# Patient Record
Sex: Male | Born: 1975 | Hispanic: Yes | Marital: Single | Smoking: Current every day smoker
Health system: Southern US, Community
[De-identification: ages and names within clinical notes are randomized; demographics above are authoritative.]

---

## 2013-07-06 ENCOUNTER — Emergency Department (HOSPITAL_COMMUNITY): Payer: Self-pay

## 2013-07-06 ENCOUNTER — Emergency Department (HOSPITAL_COMMUNITY)
Admission: EM | Admit: 2013-07-06 | Discharge: 2013-07-06 | Disposition: A | Discharge status: Home / Self Care | Payer: Self-pay | Attending: Emergency Medicine | Admitting: Emergency Medicine

## 2013-07-06 ENCOUNTER — Encounter (HOSPITAL_COMMUNITY): Payer: Self-pay | Admitting: Emergency Medicine

## 2013-07-06 DIAGNOSIS — F10929 Alcohol use, unspecified with intoxication, unspecified: Secondary | ICD-10-CM

## 2013-07-06 DIAGNOSIS — S21209A Unspecified open wound of unspecified back wall of thorax without penetration into thoracic cavity, initial encounter: Secondary | ICD-10-CM | POA: Insufficient documentation

## 2013-07-06 DIAGNOSIS — S21219A Laceration without foreign body of unspecified back wall of thorax without penetration into thoracic cavity, initial encounter: Secondary | ICD-10-CM

## 2013-07-06 DIAGNOSIS — S51009A Unspecified open wound of unspecified elbow, initial encounter: Secondary | ICD-10-CM | POA: Insufficient documentation

## 2013-07-06 DIAGNOSIS — S0180XA Unspecified open wound of other part of head, initial encounter: Secondary | ICD-10-CM | POA: Insufficient documentation

## 2013-07-06 DIAGNOSIS — R6510 Systemic inflammatory response syndrome (SIRS) of non-infectious origin without acute organ dysfunction: Secondary | ICD-10-CM | POA: Insufficient documentation

## 2013-07-06 DIAGNOSIS — Z23 Encounter for immunization: Secondary | ICD-10-CM | POA: Insufficient documentation

## 2013-07-06 DIAGNOSIS — S01119A Laceration without foreign body of unspecified eyelid and periocular area, initial encounter: Secondary | ICD-10-CM

## 2013-07-06 DIAGNOSIS — F10229 Alcohol dependence with intoxication, unspecified: Secondary | ICD-10-CM | POA: Insufficient documentation

## 2013-07-06 LAB — I-STAT CHEM 8, ED
BUN: 5 mg/dL — ABNORMAL LOW (ref 6–23)
CALCIUM ION: 1.28 mmol/L — AB (ref 1.12–1.23)
CREATININE: 1.5 mg/dL — AB (ref 0.50–1.35)
Chloride: 108 mEq/L (ref 96–112)
GLUCOSE: 157 mg/dL — AB (ref 70–99)
HCT: 53 % — ABNORMAL HIGH (ref 39.0–52.0)
HEMOGLOBIN: 18 g/dL — AB (ref 13.0–17.0)
POTASSIUM: 4 meq/L (ref 3.7–5.3)
Sodium: 142 mEq/L (ref 137–147)
TCO2: 11 mmol/L (ref 0–100)

## 2013-07-06 MED ORDER — SODIUM CHLORIDE 0.9 % IV SOLN
Freq: Once | INTRAVENOUS | Status: AC
  Administered 2013-07-06: 20 mL/h via INTRAVENOUS

## 2013-07-06 MED ORDER — TRAMADOL HCL 50 MG PO TABS: 50.0000 mg | ORAL_TABLET | Freq: Four times a day (QID) | ORAL | Status: DC | PRN

## 2013-07-06 MED ORDER — TETANUS-DIPHTH-ACELL PERTUSSIS 5-2.5-18.5 LF-MCG/0.5 IM SUSP
0.5000 mL | Freq: Once | INTRAMUSCULAR | Status: AC
  Administered 2013-07-06: 0.5 mL via INTRAMUSCULAR
  Filled 2013-07-06: qty 0.5

## 2013-07-06 MED ORDER — BACITRACIN ZINC 500 UNIT/GM EX OINT: 1.0000 "application " | TOPICAL_OINTMENT | Freq: Two times a day (BID) | CUTANEOUS | Status: DC

## 2013-07-06 MED ORDER — SODIUM CHLORIDE 0.9 % IV BOLUS (SEPSIS)
1000.0000 mL | Freq: Once | INTRAVENOUS | Status: AC
  Administered 2013-07-06: 1000 mL via INTRAVENOUS
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
## 2013-07-06 NOTE — ED Provider Notes (Signed)
LACERATION REPAIR Performed by: Carolee RotaGEIPLE,Shironda Kain S Authorized by: Carolee RotaGEIPLE,Abbey Veith S Consent: Verbal consent obtained. Risks and benefits: risks, benefits and alternatives were discussed Consent given by: patient Patient identity confirmed: provided demographic data Prepped and Draped in normal sterile fashion Wound explored  Laceration Location: R middle back  Laceration Length: 12cm  No Foreign Bodies seen or palpated  Anesthesia: local infiltration  Local anesthetic: lidocaine 2% with epinephrine  Anesthetic total: 4 ml  Irrigation method: skin scrub with dermal cleanser Amount of cleaning: standard  Skin closure: 5-0 Ethilon  Number of sutures: 15  Technique: simple interrupted  Patient tolerance: Patient tolerated the procedure well with no immediate complications.    LACERATION REPAIR Performed by: Carolee RotaGEIPLE,Barrett Holthaus S Authorized by: Carolee RotaGEIPLE,Marquette Blodgett S Consent: Verbal consent obtained. Risks and benefits: risks, benefits and alternatives were discussed Consent given by: patient Patient identity confirmed: provided demographic data Prepped and Draped in normal sterile fashion Wound explored  Laceration Location: R eyebrow  Laceration Length: 2cm  No Foreign Bodies seen or palpated  Anesthesia: local infiltration  Local anesthetic: lidocaine 2% with epinephrine  Anesthetic total: 2 ml  Irrigation method: skin scrub with dermal cleanser Amount of cleaning: standard  Skin closure: 4-0 Prolene  Number of sutures: 3  Technique: simple interrupted  Patient tolerance: Patient tolerated the procedure well with no immediate complications.   Renne CriglerJoshua Safir Michalec, PA-C 07/06/13 1012

## 2013-07-06 NOTE — ED Notes (Addendum)
Alert, NAD, calm, interactive, no changes, 2nd liter NS hung/infusing, VSS, pt to xray, family at B[facility].

## 2013-07-06 NOTE — ED Notes (Signed)
Pt sleeping, arousable to voice, [facility] up to use urinal.

## 2013-07-06 NOTE — ED Notes (Signed)
[facility] alert, NAD, calm, interactive.
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
## 2013-07-12 NOTE — ED Provider Notes (Signed)
I directly supervised this procedure.   Brandt LoosenJulie Sharron Petruska, MD 07/12/13 [phone]

## 2013-08-08 ENCOUNTER — Emergency Department (HOSPITAL_COMMUNITY): Payer: Self-pay | Admitting: Anesthesiology

## 2013-08-08 ENCOUNTER — Emergency Department (HOSPITAL_COMMUNITY)
Admission: EM | Admit: 2013-08-08 | Discharge: 2013-08-08 | Disposition: A | Discharge status: Home / Self Care | Payer: Self-pay | Attending: Emergency Medicine | Admitting: Emergency Medicine

## 2013-08-08 ENCOUNTER — Emergency Department (HOSPITAL_COMMUNITY): Payer: Self-pay

## 2013-08-08 ENCOUNTER — Encounter (HOSPITAL_COMMUNITY): Payer: Self-pay | Admitting: Emergency Medicine

## 2013-08-08 ENCOUNTER — Encounter (HOSPITAL_COMMUNITY)
Admission: EM | Disposition: A | Discharge status: Home / Self Care | Payer: Self-pay | Source: Home / Self Care | Attending: Emergency Medicine

## 2013-08-08 ENCOUNTER — Encounter (HOSPITAL_COMMUNITY): Payer: Self-pay | Admitting: Anesthesiology

## 2013-08-08 DIAGNOSIS — IMO0002 Reserved for concepts with insufficient information to code with codable children: Secondary | ICD-10-CM | POA: Insufficient documentation

## 2013-08-08 DIAGNOSIS — F17200 Nicotine dependence, unspecified, uncomplicated: Secondary | ICD-10-CM | POA: Insufficient documentation

## 2013-08-08 DIAGNOSIS — K219 Gastro-esophageal reflux disease without esophagitis: Secondary | ICD-10-CM | POA: Insufficient documentation

## 2013-08-08 DIAGNOSIS — L02512 Cutaneous abscess of left hand: Secondary | ICD-10-CM

## 2013-08-08 HISTORY — PX: I & D EXTREMITY: SHX5045

## 2013-08-08 SURGERY — IRRIGATION AND DEBRIDEMENT EXTREMITY
Anesthesia: General | Laterality: Left

## 2013-08-08 MED ORDER — NEOSTIGMINE METHYLSULFATE 10 MG/10ML IV SOLN
INTRAVENOUS | Status: AC
  Filled 2013-08-08: qty 1

## 2013-08-08 MED ORDER — HYDROMORPHONE HCL PF 1 MG/ML IJ SOLN
0.2500 mg | INTRAMUSCULAR | Status: DC | PRN
  Administered 2013-08-08 (×2): 0.5 mg via INTRAVENOUS

## 2013-08-08 MED ORDER — ACETAMINOPHEN 160 MG/5ML PO SOLN: 325.0000 mg | ORAL | Status: DC | PRN

## 2013-08-08 MED ORDER — LACTATED RINGERS IV SOLN
INTRAVENOUS | Status: DC | PRN
  Administered 2013-08-08: 15:00:00 via INTRAVENOUS

## 2013-08-08 MED ORDER — LACTATED RINGERS IV SOLN: INTRAVENOUS | Status: DC

## 2013-08-08 MED ORDER — BUPIVACAINE HCL (PF) 0.25 % IJ SOLN
INTRAMUSCULAR | Status: DC | PRN
  Administered 2013-08-08: 10 mL

## 2013-08-08 MED ORDER — KETOROLAC TROMETHAMINE 30 MG/ML IJ SOLN
INTRAMUSCULAR | Status: AC
  Administered 2013-08-08: 30 mg via INTRAVENOUS
  Filled 2013-08-08: qty 1

## 2013-08-08 MED ORDER — FENTANYL CITRATE 0.05 MG/ML IJ SOLN
INTRAMUSCULAR | Status: AC
  Filled 2013-08-08: qty 5

## 2013-08-08 MED ORDER — SUCCINYLCHOLINE CHLORIDE 20 MG/ML IJ SOLN
INTRAMUSCULAR | Status: AC
  Filled 2013-08-08: qty 1

## 2013-08-08 MED ORDER — PHENYLEPHRINE 40 MCG/ML (10ML) SYRINGE FOR IV PUSH (FOR BLOOD PRESSURE SUPPORT)
PREFILLED_SYRINGE | INTRAVENOUS | Status: AC
  Filled 2013-08-08: qty 10

## 2013-08-08 MED ORDER — ROCURONIUM BROMIDE 50 MG/5ML IV SOLN
INTRAVENOUS | Status: AC
  Filled 2013-08-08: qty 1

## 2013-08-08 MED ORDER — ARTIFICIAL TEARS OP OINT
TOPICAL_OINTMENT | OPHTHALMIC | Status: AC
  Filled 2013-08-08: qty 3.5

## 2013-08-08 MED ORDER — GLYCOPYRROLATE 0.2 MG/ML IJ SOLN
INTRAMUSCULAR | Status: AC
  Filled 2013-08-08: qty 2

## 2013-08-08 MED ORDER — ONDANSETRON HCL 4 MG/2ML IJ SOLN: 4.0000 mg | Freq: Once | INTRAMUSCULAR | Status: DC | PRN

## 2013-08-08 MED ORDER — LIDOCAINE HCL (CARDIAC) 20 MG/ML IV SOLN
INTRAVENOUS | Status: DC | PRN
  Administered 2013-08-08: 80 mg via INTRAVENOUS

## 2013-08-08 MED ORDER — BUPIVACAINE HCL (PF) 0.25 % IJ SOLN
INTRAMUSCULAR | Status: AC
  Filled 2013-08-08: qty 30

## 2013-08-08 MED ORDER — OXYCODONE-ACETAMINOPHEN 5-325 MG PO TABS
2.0000 | ORAL_TABLET | Freq: Once | ORAL | Status: AC
  Administered 2013-08-08: 2 via ORAL
  Filled 2013-08-08: qty 2

## 2013-08-08 MED ORDER — ONDANSETRON HCL 4 MG/2ML IJ SOLN
INTRAMUSCULAR | Status: DC | PRN
  Administered 2013-08-08: 4 mg via INTRAVENOUS

## 2013-08-08 MED ORDER — CEFAZOLIN SODIUM-DEXTROSE 2-3 GM-% IV SOLR
INTRAVENOUS | Status: DC | PRN
  Administered 2013-08-08: 2 g via INTRAVENOUS

## 2013-08-08 MED ORDER — ONDANSETRON HCL 4 MG/2ML IJ SOLN
INTRAMUSCULAR | Status: AC
  Filled 2013-08-08: qty 2

## 2013-08-08 MED ORDER — FENTANYL CITRATE 0.05 MG/ML IJ SOLN
INTRAMUSCULAR | Status: DC | PRN
  Administered 2013-08-08: 25 ug via INTRAVENOUS
  Administered 2013-08-08: 50 ug via INTRAVENOUS
  Administered 2013-08-08: 75 ug via INTRAVENOUS
  Administered 2013-08-08: 50 ug via INTRAVENOUS
  Administered 2013-08-08 (×2): 25 ug via INTRAVENOUS

## 2013-08-08 MED ORDER — PROPOFOL 10 MG/ML IV BOLUS
INTRAVENOUS | Status: DC | PRN
  Administered 2013-08-08: 200 mg via INTRAVENOUS

## 2013-08-08 MED ORDER — SULFAMETHOXAZOLE-TRIMETHOPRIM 800-160 MG PO TABS: 1.0000 | ORAL_TABLET | Freq: Two times a day (BID) | ORAL | Status: AC

## 2013-08-08 MED ORDER — MIDAZOLAM HCL 5 MG/5ML IJ SOLN
INTRAMUSCULAR | Status: DC | PRN
  Administered 2013-08-08: 2 mg via INTRAVENOUS

## 2013-08-08 MED ORDER — 0.9 % SODIUM CHLORIDE (POUR BTL) OPTIME
TOPICAL | Status: DC | PRN
  Administered 2013-08-08: 1000 mL

## 2013-08-08 MED ORDER — MIDAZOLAM HCL 2 MG/2ML IJ SOLN
INTRAMUSCULAR | Status: AC
  Filled 2013-08-08: qty 2

## 2013-08-08 MED ORDER — OXYCODONE HCL 5 MG PO TABS
ORAL_TABLET | ORAL | Status: AC
  Administered 2013-08-08: 5 mg
  Filled 2013-08-08: qty 1

## 2013-08-08 MED ORDER — HYDROMORPHONE HCL PF 1 MG/ML IJ SOLN
INTRAMUSCULAR | Status: AC
  Administered 2013-08-08: 0.5 mg via INTRAVENOUS
  Filled 2013-08-08: qty 1

## 2013-08-08 MED ORDER — PROPOFOL 10 MG/ML IV BOLUS
INTRAVENOUS | Status: AC
  Filled 2013-08-08: qty 20

## 2013-08-08 MED ORDER — EPHEDRINE SULFATE 50 MG/ML IJ SOLN
INTRAMUSCULAR | Status: AC
  Filled 2013-08-08: qty 1

## 2013-08-08 MED ORDER — KETOROLAC TROMETHAMINE 30 MG/ML IJ SOLN
15.0000 mg | Freq: Once | INTRAMUSCULAR | Status: AC | PRN
  Administered 2013-08-08: 30 mg via INTRAVENOUS

## 2013-08-08 MED ORDER — HYDROCODONE-ACETAMINOPHEN 5-325 MG PO TABS: ORAL_TABLET | ORAL | Status: AC

## 2013-08-08 MED ORDER — ACETAMINOPHEN 325 MG PO TABS: 325.0000 mg | ORAL_TABLET | ORAL | Status: DC | PRN

## 2013-08-08 SURGICAL SUPPLY — 31 items
BNDG COHESIVE 1X5 TAN STRL LF (GAUZE/BANDAGES/DRESSINGS) ×3 IMPLANT
BNDG ESMARK 4X9 LF (GAUZE/BANDAGES/DRESSINGS) IMPLANT
CORDS BIPOLAR (ELECTRODE) ×3 IMPLANT
COVER SURGICAL LIGHT HANDLE (MISCELLANEOUS) ×3 IMPLANT
GAUZE XEROFORM 1X8 LF (GAUZE/BANDAGES/DRESSINGS) ×3 IMPLANT
GLOVE BIO SURGEON STRL SZ7.5 (GLOVE) ×3 IMPLANT
GLOVE BIOGEL PI IND STRL 8 (GLOVE) ×1 IMPLANT
GLOVE BIOGEL PI INDICATOR 8 (GLOVE) ×2
GOWN STRL REIN XL XLG (GOWN DISPOSABLE) ×3 IMPLANT
KIT BASIN OR (CUSTOM PROCEDURE TRAY) ×3 IMPLANT
KIT ROOM TURNOVER OR (KITS) ×3 IMPLANT
LOOP VESSEL MAXI BLUE (MISCELLANEOUS) IMPLANT
LOOP VESSEL MINI RED (MISCELLANEOUS) IMPLANT
MANIFOLD NEPTUNE II (INSTRUMENTS) ×3 IMPLANT
NEEDLE HYPO 25X1 1.5 SAFETY (NEEDLE) ×3 IMPLANT
NS IRRIG 1000ML POUR BTL (IV SOLUTION) ×3 IMPLANT
PACK ORTHO EXTREMITY (CUSTOM PROCEDURE TRAY) ×3 IMPLANT
PAD ARMBOARD 7.5X6 YLW CONV (MISCELLANEOUS) ×6 IMPLANT
SCRUB BETADINE 4OZ XXX (MISCELLANEOUS) ×3 IMPLANT
SOLUTION BETADINE 4OZ (MISCELLANEOUS) ×3 IMPLANT
SPONGE GAUZE 4X4 12PLY (GAUZE/BANDAGES/DRESSINGS) ×3 IMPLANT
SPONGE LAP 18X18 X RAY DECT (DISPOSABLE) ×3 IMPLANT
SUCTION FRAZIER TIP 10 FR DISP (SUCTIONS) ×3 IMPLANT
SUT MON AB 5-0 P3 18 (SUTURE) IMPLANT
SYR CONTROL 10ML LL (SYRINGE) ×3 IMPLANT
TOWEL OR 17X24 6PK STRL BLUE (TOWEL DISPOSABLE) ×3 IMPLANT
TOWEL OR 17X26 10 PK STRL BLUE (TOWEL DISPOSABLE) ×3 IMPLANT
TUBE ANAEROBIC SPECIMEN COL (MISCELLANEOUS) ×3 IMPLANT
TUBE CONNECTING 12'X1/4 (SUCTIONS) ×1
TUBE CONNECTING 12X1/4 (SUCTIONS) ×2 IMPLANT
UNDERPAD 30X30 INCONTINENT (UNDERPADS AND DIAPERS) ×3 IMPLANT

## 2013-08-08 NOTE — ED Provider Notes (Addendum)
Medical screening examination/treatment/procedure(s) were conducted as a shared visit with non-physician practitioner(s) and myself.  I personally evaluated the patient during the encounter.  L index finger felon with purulent drainage on palmar surface.  Flexion and extension of PIP, DIP, MCP intact.   EKG Interpretation None         Glynn OctaveStephen Birl Lobello, MD 08/08/13 1541  Glynn OctaveStephen Adia Crammer, MD 08/08/13 [phone]

## 2013-08-08 NOTE — Discharge Instructions (Signed)

## 2013-08-08 NOTE — ED Notes (Signed)
Pt transported to and from radiology on stretcher with tech, tolerated well.  

## 2013-08-08 NOTE — Discharge Planning (Signed)
[facility] Community Liaison  Spoke to patient regarding primary care resources and the [facility]. Orange card application and instructions on how to complete and obtain the orange card provided. Resource guide and my contact information also provided for any future questions or concerns. No other Community Liaison needs identified at this time.

## 2013-08-08 NOTE — ED Notes (Signed)
Dr Kuzma at bedside. 

## 2013-08-08 NOTE — Anesthesia Postprocedure Evaluation (Signed)
  Anesthesia Post-op Note  Patient: Adrian Houston  Procedure(s) Performed: Procedure(s): IRRIGATION AND DEBRIDEMENT EXTREMITY  LEFT INDEX FINGER (Left)  Patient Location: PACU  Anesthesia Type: General   Level of Consciousness: awake, alert  and oriented  Airway and Oxygen Therapy: Patient Spontanous Breathing  Post-op Pain: mild  Post-op Assessment: Post-op Vital signs reviewed  Post-op Vital Signs: Reviewed  Last Vitals:  Filed Vitals:   08/08/13 1800  BP:   Pulse: 66  Temp:   Resp: 15    Complications: No apparent anesthesia complications

## 2013-08-08 NOTE — Brief Op Note (Signed)
08/08/2013  4:18 PM  PATIENT:  Adrian Houston  38 y.o. male  PRE-OPERATIVE DIAGNOSIS:  infected left index finger  POST-OPERATIVE DIAGNOSIS:  infected left index finger  PROCEDURE:  Procedure(s): IRRIGATION AND DEBRIDEMENT EXTREMITY  LEFT INDEX FINGER (Left)  SURGEON:  Surgeon(s) and Role:    * Tami RibasKevin R Aubrionna Istre, MD - Primary  PHYSICIAN ASSISTANT:   ASSISTANTS: none   ANESTHESIA:   general  EBL:  Total I/O In: 600 [I.V.:600] Out: -   BLOOD ADMINISTERED:none  DRAINS: iodoform packing  LOCAL MEDICATIONS USED:  MARCAINE     SPECIMEN:  Source of Specimen:  left index finger  DISPOSITION OF SPECIMEN:  micro  COUNTS:  YES  TOURNIQUET:   Total Tourniquet Time Documented: Upper Arm (Left) - 16 minutes Total: Upper Arm (Left) - 16 minutes   DICTATION: .Other Dictation: Dictation Number [id number]  PLAN OF CARE: Discharge to home after PACU  PATIENT DISPOSITION:  PACU - hemodynamically stable.

## 2013-08-08 NOTE — H&P (Signed)
  Adrian Houston is an 38 y.o. male.   Chief Complaint: left index finger infection/foreign body HPI: 38 yo rhd male states his left index finger has been swelling and increasingly painful over past two to three days.  Thinks he got a piece of foreign material in finger.  States it feels as though there is something in the finger still.  No previous problems with left index finger and no other issue at this time.  History reviewed. No pertinent past medical history.  No past surgical history on file.  No family history on file. Social History:  reports that he has been smoking.  He does not have any smokeless tobacco history on file. He reports that he drinks alcohol. He reports that he does not use illicit drugs.  Allergies: No Known Allergies   (Not in a hospital admission)  No results found for this or any previous visit (from the past 48 hour(s)).  Dg Finger Index Left  08/08/2013   CLINICAL DATA:  Recent puncture wound now with pain and swelling for 2 days  EXAM: LEFT INDEX FINGER 2+V  COMPARISON:  None.  FINDINGS: There is soft tissue swelling over the distal aspect of the index finger. There is no soft tissue gas. The bony structures are unremarkable. No radiopaque foreign body is evident.  IMPRESSION: There is soft tissue swelling consistent with known inflammatory change. No acute bony abnormality is demonstrated.   Electronically Signed   By: David  [place]azil[place]   On: 08/08/2013 12:15     A comprehensive review of systems was negative.  Blood pressure 153/86, pulse 63, temperature 98 F (36.7 C), resp. rate 24, height 5\' 6"  (1.676 m), weight 108.863 kg (240 lb), SpO2 100.00%.  General appearance: alert, cooperative and appears stated age Head: Normocephalic, without obvious abnormality, atraumatic Neck: supple, symmetrical, trachea midline Resp: clear to auscultation bilaterally Cardio: regular rate and rhythm GI: non tender Extremities: intact sensation and capillary refill all  digits.  +epl/fpl/io.  left index with swelling over pad and active purulent drainage.  erythema dorsally at dorsal nail fold as well.  ttp distal phalanx.  no proximal streaking.  no tenderness/swelling/erythema over middle or proximal phalanges.  able to flex and extend finger without pain. Pulses: 2+ and symmetric Skin: Skin color, texture, turgor normal. No rashes or lesions Neurologic: Grossly normal Incision/Wound: As above  Assessment/Plan Left index finger felon/paronychia possible retained foreign body.  Plan OR for I&D and possible removal foreign body.  Risks, benefits, and alternatives of surgery were discussed and the patient agrees with the plan of care.   Adrian Houston R 08/08/2013, 2:42 PM

## 2013-08-08 NOTE — ED Provider Notes (Signed)
CSN: [id number]     Arrival date & time 08/08/13  1021 History  This chart was scribed for non-physician practitioner Junius FinnerErin O'Malley, working with Glynn OctaveStephen Rancour, MD by Carl Bestelina Holson, ED Scribe. This patient was seen in room OTFC/OTF and the patient's care was started at 11:07 AM.    Chief Complaint  Patient presents with  . Finger Injury   The history is provided by the patient. No language interpreter was used.   HPI Comments: Adrian Houston is a 38 y.o. male who presents to the Emergency Department complaining of constant left index finger pain with associated swelling that started two days ago after the patient noticed a splinter in the area.  The patient rates the pain at a 9/10 currently.  He denies numbness in his left index finger as an associated symptom.  The patient denies having a history of DM or other medical problems.  The patient's last TDAP was this month.  He denies having any allergies to medications.  The patient states that he ate and drank coffee this morning.   History reviewed. No pertinent past medical history. No past surgical history on file. No family history on file. History  Substance Use Topics  . Smoking status: Current Every Day Smoker  . Smokeless tobacco: Not on file  . Alcohol Use: Yes    Review of Systems  Constitutional: Negative for fever.  Musculoskeletal: Positive for arthralgias and joint swelling.  Skin: Positive for wound.  Neurological: Negative for numbness.  All other systems reviewed and are negative.     Allergies  Review of patient's allergies indicates no known allergies.  Home Medications   Prior to Admission medications   Medication Sig Start Date End Date Taking? Authorizing Provider  acetaminophen (TYLENOL) 325 MG tablet Take 650 mg by mouth every 6 (six) hours as needed.   Yes Historical Provider, MD  ibuprofen (ADVIL,MOTRIN) 200 MG tablet Take 400 mg by mouth every 6 (six) hours as needed for moderate pain.     Historical Provider, MD   Triage Vitals: BP 156/98  Pulse 90  Temp(Src) 98 F (36.7 C)  Resp 18  Ht 5\' 6"  (1.676 m)  Wt 240 lb (108.863 kg)  BMI 38.76 kg/m2  SpO2 97%  Physical Exam  Nursing note and vitals reviewed. Constitutional: He is oriented to person, place, and time. He appears well-developed and well-nourished.  HENT:  Head: Normocephalic and atraumatic.  Eyes: EOM are normal.  Neck: Normal range of motion.  Cardiovascular: Normal rate.   Pulmonary/Chest: Effort normal.  Musculoskeletal: Normal range of motion.  Full range of motion of the left index finger.    Neurological: He is alert and oriented to person, place, and time.  Skin: Skin is warm and dry.  Left index finger distal phalanx moderate edema.  Tenderness on volar aspect with centralized area of dried blood and scant red oozing blood.  Capillary refill is less than 3 seconds.  No nailbed involvement.  Psychiatric: He has a normal mood and affect. His behavior is normal.    ED Course  Procedures (including critical care time)  DIAGNOSTIC STUDIES: Oxygen Saturation is 97% on room air, adequate by my interpretation.    COORDINATION OF CARE: 11:08 AM- Discussed obtaining an x-ray of the patient's left hand and consulting with a hand specialist to verify a clinical suspicion of infection in the patient's left index finger.  Will administer pain medication in the ED.  The patient agreed to the treatment plan.  Labs Review Labs Reviewed - No data to display  Imaging Review Dg Finger Index Left  08/08/2013   CLINICAL DATA:  Recent puncture wound now with pain and swelling for 2 days  EXAM: LEFT INDEX FINGER 2+V  COMPARISON:  None.  FINDINGS: There is soft tissue swelling over the distal aspect of the index finger. There is no soft tissue gas. The bony structures are unremarkable. No radiopaque foreign body is evident.  IMPRESSION: There is soft tissue swelling consistent with known inflammatory change. No  acute bony abnormality is demonstrated.   Electronically Signed   By: David  [place]   On: 08/08/2013 12:15     EKG Interpretation None      MDM   Final diagnoses:  Felon, left    Pt is a 38yo male presenting to ED c/o left index finger pain x2 days. Pt states he feels like he has a splinter or piece of metal inside. No known injury. On exam, left distal index finger characteristic of a felon.  Plain films: soft tissue swelling, no acute bony abnormality or foreign body.  Consulted with Dr. Merlyn Lot who also examined pt. Pt will be taken to the OR for I&D as pt believes something is still in his finger.    I personally performed the services described in this documentation, which was scribed in my presence. The recorded information has been reviewed and is accurate.    Junius Finner, PA-C 08/08/13 1452

## 2013-08-08 NOTE — Op Note (Signed)
[id number] 

## 2013-08-08 NOTE — Transfer of Care (Signed)
Immediate Anesthesia Transfer of Care Note  Patient: Adrian Houston  Procedure(s) Performed: Procedure(s): IRRIGATION AND DEBRIDEMENT EXTREMITY  LEFT INDEX FINGER (Left)  Patient Location: PACU  Anesthesia Type:General  Level of Consciousness: awake, alert  and oriented  Airway & Oxygen Therapy: Patient Spontanous Breathing and Patient connected to face mask oxygen  Post-op Assessment: Report given to PACU RN  Post vital signs: Reviewed and stable  Complications: No apparent anesthesia complications

## 2013-08-08 NOTE — ED Notes (Signed)
Patient's consent for surgery signed and placed at bedside.

## 2013-08-08 NOTE — Anesthesia Preprocedure Evaluation (Addendum)
Anesthesia Evaluation  Patient identified by MRN, date of birth, ID band Patient awake    Reviewed: Allergy & Precautions, H&P , NPO status , Patient's Chart, lab work & pertinent test results  History of Anesthesia Complications Negative for: history of anesthetic complications  Airway Mallampati: II TM Distance: >3 FB Neck ROM: Full    Dental  (+) Teeth Intact,    Pulmonary neg sleep apnea, neg COPDneg recent URI, Current Smoker,  breath sounds clear to auscultation  Pulmonary exam normal       Cardiovascular negative cardio ROS  Rhythm:Regular     Neuro/Psych  Headaches, negative psych ROS   GI/Hepatic Neg liver ROS, GERD-  Medicated and Poorly Controlled,  Endo/Other  Morbid obesity  Renal/GU negative Renal ROS     Musculoskeletal   Abdominal Normal abdominal exam  (+)   Peds  Hematology negative hematology ROS (+)   Anesthesia Other Findings   Reproductive/Obstetrics                          Anesthesia Physical Anesthesia Plan  ASA: II  Anesthesia Plan: General   Post-op Pain Management:    Induction: Intravenous  Airway Management Planned: LMA and Oral ETT  Additional Equipment: None  Intra-op Plan:   Post-operative Plan: Extubation in OR  Informed Consent: I have reviewed the patients History and Physical, chart, labs and discussed the procedure including the risks, benefits and alternatives for the proposed anesthesia with the patient or authorized representative who has indicated his/her understanding and acceptance.   Dental advisory given  Plan Discussed with: CRNA, Anesthesiologist and Surgeon  Anesthesia Plan Comments:        Anesthesia Quick Evaluation

## 2013-08-08 NOTE — ED Notes (Signed)
Left finger injury 2 days ago index  States had something in it andtried to get it out now hurts

## 2013-08-09 ENCOUNTER — Encounter (HOSPITAL_COMMUNITY): Payer: Self-pay | Admitting: Orthopedic Surgery

## 2013-08-09 NOTE — Op Note (Signed)
NAME:  Adrian Houston, Adrian Houston              ACCOUNT NO.:  [id number]  MEDICAL RECORD NO.:  [id number]  LOCATION:  [facility]                         FACILITY:  [facility]  PHYSICIAN:  Betha LoaKevin Ailee Pates, MD        DATE OF BIRTH:  02-28-1975  DATE OF PROCEDURE:  08/08/2013 DATE OF DISCHARGE:  08/08/2013                              OPERATIVE REPORT   PREOPERATIVE DIAGNOSIS:  Left index finger felon, possible paronychia.  POSTOPERATIVE DIAGNOSIS:  Left index finger felon, possible paronychia.  PROCEDURE:  Left index finger incision and drainage of felon.  SURGEON:  Betha LoaKevin Georgio Hattabaugh, MD  ASSISTANT:  None.  ANESTHESIA:  General.  IV FLUIDS:  Per anesthesia flow sheet.  ESTIMATED BLOOD LOSS:  Minimal.  COMPLICATIONS:  None.  SPECIMENS:  Left index finger cultures to micro.  TOURNIQUET TIME:  16 minutes.  DISPOSITION:  Stable to PACU.  INDICATIONS:  Adrian Houston is a [age] year old right-hand dominant male who states he has had increased pain and swelling of the left index finger in the pad for the past 2-3 days.  He thinks it was poked by something, possibly wood.  He feels like there is something remaining in the finger.  Presents to the emergency department.  On evaluation, he had a wound in the pad of the finger draining purulence.  There was no proximal streaking.  No tenderness over the proximal or middle phalanges.  I recommended going to the operating room for incision and drainage of the felon, possible paronychia as it was swollen, tender in the dorsal nail fold as well.  We would also look for any retained foreign body. Risks, benefits, and alternatives of surgery were discussed including risk of blood loss, infection, damage to nerves, vessels, tendons, ligaments, bone; failure of surgery; need for additional surgery, complications with wound healing, continued pain, continued infection, need for repeat irrigation and debridement.  He voiced understanding of these risks and elected to  proceed.  OPERATIVE COURSE:  After being identified preoperatively by myself, the patient and I agreed upon procedure and site of procedure.  Surgical site was marked.  Risks, benefits, and alternatives of surgery were reviewed and he wished to proceed.  Surgical consent had been signed. He was transported to the operating room and placed on the operating room table in a supine position with left upper extremity on arm board. General anesthesia was induced by anesthesiologist.  Left upper extremity was prepped and draped in normal sterile orthopedic fashion. Surgical pause was performed between surgeons, anesthesia, and operating room staff, and all were in agreement as to the patient, procedure, and site of procedure.  Tourniquet at the proximal aspect of the extremity was inflated to 250 mmHg after exsanguination of the hand and forearm with an Esmarch bandage.  Incision was made on the pad of the finger including the traumatic portion of the wound that was draining.  Gross purulence was encountered.  Cultures were taken for aerobes and anaerobes.  The wound cavity was explored.  It tracked both radially and ulnarly.  All septae were divided.  It did not seem to track into the flexor tendon sheath.  The wound was copiously irrigated with sterile saline and the  skin debrided sharply with the scissors.  The subcutaneous tissues were debrided with the Ray-Tec sponge.  The fingernail was removed.  There was no gross purulence under the nail. It could be seen where the infection had tracked to the edge of the radial nail fold, but had not come through yet.  The wound was copiously irrigated with sterile saline.  It was then packed with quarter-inch iodoform gauze.  A piece of Xeroform was placed in the nail fold and the nail bed dressed with sterile Xeroform.  The wounds were then dressed with sterile 4x4s and wrapped with a Coban dressing lightly.  An AlumaFoam splint was placed and  wrapped with Coban dressing lightly.  A digital block was performed with 10 mL of 0.25% plain Marcaine to aid in postoperative analgesia.  He was given IV Ancef as coverage for the infection after cultures had been taken.  Tourniquet was deflated at 16 minutes.  The operative drapes were broken down and the patient was awoken from anesthesia safely.  He was transferred back to the stretcher and taken to PACU in stable condition.  I will see him back in the office in 4 days for postoperative followup.  We will give him Norco 5/325, one to two p.o. q.6 hours p.r.n. pain, dispensed #30 and Bactrim DS 1 p.o. b.i.d. x7 days.     Betha Loa, MD     KK/MEDQ  D:  08/08/2013  T:  08/09/2013  Job:  [id number]

## 2013-08-11 LAB — WOUND CULTURE: GRAM STAIN: NONE SEEN

## 2013-08-13 LAB — ANAEROBIC CULTURE: Gram Stain: NONE SEEN

## 2018-07-29 ENCOUNTER — Other Ambulatory Visit: Payer: Self-pay

## 2018-07-29 ENCOUNTER — Encounter (HOSPITAL_COMMUNITY): Payer: Self-pay | Admitting: Emergency Medicine

## 2018-07-29 ENCOUNTER — Emergency Department (HOSPITAL_COMMUNITY)
Admission: EM | Admit: 2018-07-29 | Discharge: 2018-07-30 | Disposition: A | Discharge status: Home / Self Care | Payer: BC Managed Care – PPO | Attending: Emergency Medicine | Admitting: Emergency Medicine

## 2018-07-29 ENCOUNTER — Emergency Department (HOSPITAL_COMMUNITY): Payer: BC Managed Care – PPO

## 2018-07-29 DIAGNOSIS — R0781 Pleurodynia: Secondary | ICD-10-CM | POA: Diagnosis present

## 2018-07-29 DIAGNOSIS — R079 Chest pain, unspecified: Secondary | ICD-10-CM | POA: Diagnosis not present

## 2018-07-29 DIAGNOSIS — F17200 Nicotine dependence, unspecified, uncomplicated: Secondary | ICD-10-CM | POA: Diagnosis not present

## 2018-07-29 NOTE — ED Triage Notes (Signed)
C/o L posterior rib pain x 1 day, pt denies injury, worse with deep breath.

## 2018-07-30 ENCOUNTER — Other Ambulatory Visit: Payer: Self-pay

## 2018-07-30 LAB — CBC WITH DIFFERENTIAL/PLATELET
Abs Immature Granulocytes: 0.09 10*3/uL — ABNORMAL HIGH (ref 0.00–0.07)
Basophils Absolute: 0.1 10*3/uL (ref 0.0–0.1)
Basophils Relative: 1 %
Eosinophils Absolute: 0.3 10*3/uL (ref 0.0–0.5)
Eosinophils Relative: 2 %
HCT: 41.6 % (ref 39.0–52.0)
Hemoglobin: 13.9 g/dL (ref 13.0–17.0)
Immature Granulocytes: 1 %
Lymphocytes Relative: 25 %
Lymphs Abs: 3 10*3/uL (ref 0.7–4.0)
MCH: 34.3 pg — ABNORMAL HIGH (ref 26.0–34.0)
MCHC: 33.4 g/dL (ref 30.0–36.0)
MCV: 102.7 fL — ABNORMAL HIGH (ref 80.0–100.0)
Monocytes Absolute: 1.4 10*3/uL — ABNORMAL HIGH (ref 0.1–1.0)
Monocytes Relative: 11 %
Neutro Abs: 7.5 10*3/uL (ref 1.7–7.7)
Neutrophils Relative %: 60 %
Platelets: 256 10*3/uL (ref 150–400)
RBC: 4.05 MIL/uL — ABNORMAL LOW (ref 4.22–5.81)
RDW: 14.1 % (ref 11.5–15.5)
WBC: 12.4 10*3/uL — ABNORMAL HIGH (ref 4.0–10.5)
nRBC: 0 % (ref 0.0–0.2)

## 2018-07-30 LAB — TROPONIN I (HIGH SENSITIVITY): Troponin I (High Sensitivity): 3 ng/L (ref <18)

## 2018-07-30 LAB — BASIC METABOLIC PANEL
Anion gap: 11 (ref 5–15)
BUN: 9 mg/dL (ref 6–20)
CO2: 24 mmol/L (ref 22–32)
Calcium: 9.1 mg/dL (ref 8.9–10.3)
Chloride: 105 mmol/L (ref 98–111)
Creatinine, Ser: 0.97 mg/dL (ref 0.61–1.24)
GFR calc Af Amer: >60 mL/min (ref >60)
GFR calc non Af Amer: >60 mL/min (ref >60)
Glucose, Bld: 94 mg/dL (ref 70–99)
Potassium: 4.4 mmol/L (ref 3.5–5.1)
Sodium: 140 mmol/L (ref 135–145)

## 2018-07-30 LAB — D-DIMER, QUANTITATIVE: D-Dimer, Quant: 0.31 ug/mL-FEU (ref 0.00–0.50)

## 2018-07-30 MED ORDER — NAPROXEN 250 MG PO TABS
500.0000 mg | ORAL_TABLET | Freq: Once | ORAL | Status: AC
  Administered 2018-07-30: 02:00:00 500 mg via ORAL
  Filled 2018-07-30: qty 2

## 2018-07-30 MED ORDER — METHOCARBAMOL 500 MG PO TABS
500.0000 mg | ORAL_TABLET | Freq: Once | ORAL | Status: AC
  Administered 2018-07-30: 02:00:00 500 mg via ORAL
  Filled 2018-07-30: qty 1

## 2018-07-30 MED ORDER — METHOCARBAMOL 500 MG PO TABS: 500.0000 mg | ORAL_TABLET | Freq: Two times a day (BID) | ORAL | 0 refills | Status: AC

## 2018-07-30 MED ORDER — NAPROXEN 500 MG PO TABS: 500.0000 mg | ORAL_TABLET | Freq: Two times a day (BID) | ORAL | 0 refills | Status: AC

## 2018-07-30 NOTE — ED Notes (Signed)
Patient verbalizes understanding of discharge instructions. Opportunity for questioning and answers were provided. Armband removed by staff, pt discharged from ED.  

## 2018-07-30 NOTE — ED Provider Notes (Signed)
[facility] EMERGENCY DEPARTMENT Provider Note   CSN: [id number] Arrival date & time: 07/29/18  1956     History   Chief Complaint Chief Complaint  Patient presents with   Posterior Rib pain L    HPI Adrian Houston is a 43 y.o. male.     The history is provided by the patient and medical records.     43 year old male presenting to the ED with left-sided chest and back pain.  States this is been ongoing for about 3 or 4 days now but seems to be getting worse.  Reports most of the pain seems to be in his left upper back but at times radiates through to the front of his chest as well.  Pain is sharp, stabbing, feels like it is deep inside.  Pain is worse with deep breathing and certain movements.   No injury, trauma, or falls.  He denies cough, fever, chills, sweats, or feeling ill.  He has no known cardiac history.  Only family member with heart disease was grandfather.  He is a daily smoker.  He did travel to [place] 2 weeks ago via car.  No hx of DVT or PE.  Denies LE edema or calf pain.  No known sick contacts or COVID exposures.  History reviewed. No pertinent past medical history.  There are no active problems to display for this patient.   Past Surgical History:  Procedure Laterality Date   I&D EXTREMITY Left 08/08/2013   Procedure: IRRIGATION AND DEBRIDEMENT EXTREMITY  LEFT INDEX FINGER;  Surgeon: Tennis Must, MD;  Location: [facility];  Service: Orthopedics;  Laterality: Left;        Home Medications    Prior to Admission medications   Medication Sig Start Date End Date Taking? Authorizing Provider  HYDROcodone-acetaminophen (NORCO) 5-325 MG per tablet 1-2 tabs po q6 hours prn pain 08/08/13   Leanora Cover, MD  ibuprofen (ADVIL,MOTRIN) 200 MG tablet Take 400 mg by mouth every 6 (six) hours as needed for moderate pain.    [provider]  sulfamethoxazole-trimethoprim (BACTRIM DS) 800-160 MG per tablet Take 1 tablet by mouth 2 (two) times daily.  08/08/13   Leanora Cover, MD    Family History No family history on file.  Social History Social History   Tobacco Use   Smoking status: Current Every Day Smoker   Smokeless tobacco: Never Used  Substance Use Topics   Alcohol use: Yes   Drug use: Yes    Types: Marijuana     Allergies   Patient has no known allergies.   Review of Systems Review of Systems  Cardiovascular: Positive for chest pain.  All other systems reviewed and are negative.    Physical Exam Updated Vital Signs BP 133/79 (BP Location: Left Arm)    Pulse 78    Temp 99.5 F (37.5 C) (Oral)    Resp (!) 25    Ht 5\' 7"  (1.702 m)    Wt 108 kg    SpO2 99%    BMI 37.29 kg/m   Physical Exam Vitals signs and nursing note reviewed.  Constitutional:      Appearance: He is well-developed.  HENT:     Head: Normocephalic and atraumatic.  Eyes:     Conjunctiva/sclera: Conjunctivae normal.     Pupils: Pupils are equal, round, and reactive to light.  Neck:     Musculoskeletal: Normal range of motion.  Cardiovascular:     Rate and Rhythm: Normal rate and regular  rhythm.     Heart sounds: Normal heart sounds.  Pulmonary:     Effort: Pulmonary effort is normal.     Breath sounds: Normal breath sounds.  Chest:     Comments: Anterior chest wall is nontender, some mild tenderness over the left lower posterior ribs, no noted deformity or signs of trauma Abdominal:     General: Bowel sounds are normal.     Palpations: Abdomen is soft.  Musculoskeletal: Normal range of motion.  Skin:    General: Skin is warm and dry.  Neurological:     Mental Status: He is alert and oriented to person, place, and time.      ED Treatments / Results  Labs (all labs ordered are listed, but only abnormal results are displayed) Labs Reviewed  CBC WITH DIFFERENTIAL/PLATELET - Abnormal; Notable for the following components:      Result Value   WBC 12.4 (*)    RBC 4.05 (*)    MCV 102.7 (*)    MCH 34.3 (*)    Monocytes  Absolute 1.4 (*)    Abs Immature Granulocytes 0.09 (*)    All other components within normal limits  BASIC METABOLIC PANEL  TROPONIN I (HIGH SENSITIVITY)  D-DIMER, QUANTITATIVE (NOT AT [facility])    EKG EKG Interpretation  Date/Time:  Monday July 30 2018 01:14:34 EDT Ventricular Rate:  59 PR Interval:    QRS Duration: 90 QT Interval:  452 QTC Calculation: 448 R Axis:   28 Text Interpretation:  Sinus rhythm Borderline short PR interval No old tracing to compare Confirmed by Rochele RaringWard, Kristen [phone]([id number]) on 07/30/2018 2:50:09 AM   Radiology Dg Chest 2 View  Result Date: 07/29/2018 CLINICAL DATA:  Patient with rib pain. EXAM: CHEST - 2 VIEW COMPARISON:  Chest radiograph 07/06/2013. FINDINGS: Stable cardiac and mediastinal contours. No consolidative pulmonary opacities. No pleural effusion or pneumothorax. No evidence for acute displaced rib fracture. IMPRESSION: No acute cardiopulmonary process. Electronically Signed   By: Annia Beltrew  Davis M.D.   On: 07/29/2018 21:38    Procedures Procedures (including critical care time)  Medications Ordered in ED Medications  naproxen (NAPROSYN) tablet 500 mg (500 mg Oral Given 07/30/18 0228)  methocarbamol (ROBAXIN) tablet 500 mg (500 mg Oral Given 07/30/18 0229)     Initial Impression / Assessment and Plan / ED Course  I have reviewed the triage vital signs and the nursing notes.  Pertinent labs & imaging results that were available during my care of the patient were reviewed by me and considered in my medical decision making (see chart for details).  43 year old male here with left posterior rib and intermittent chest pain over the past 2 days.  He denies any recent falls, trauma, or heavy lifting.  He has no known cardiac history.  He is a smoker.  Did recently travel to [place] by car about 2 weeks ago.  Patient is nontoxic in appearance here and in no acute distress.  His vitals are stable.  He does report pleuritic nature to his pain that is worse with deep  breathing.  He does have some mild tenderness of the left lower posterior ribs but no signs of trauma.  Chest x-ray was obtained from triage and is negative.  Plan for EKG and screening labs including d-dimer.  He has not had any sick contacts or known COVID exposures and denies any recent cough or fever so feel this is less likely.  Patient's labs are all reassuring, troponin and d-dimer are both negative.  His EKG is nonischemic.  Patient's vitals remained stable without any tachycardia or hypoxia.  Low suspicion for ACS, PE, dissection, acute cardiac event.  This may be musculoskeletal.  Will treat symptomatically.  Recommended that he follow-up closely with primary care doctor.  Return here for any new or acute changes.  Laurin Earnest ConroyFlores was evaluated in Emergency Department on 07/30/2018 for the symptoms described in the history of present illness. He was evaluated in the context of the global COVID-19 pandemic, which necessitated consideration that the patient might be at risk for infection with the SARS-CoV-2 virus that causes COVID-19. Institutional protocols and algorithms that pertain to the evaluation of patients at risk for COVID-19 are in a state of rapid change based on information released by regulatory bodies including the CDC and federal and state organizations. These policies and algorithms were followed during the patient's care in the ED.   Final Clinical Impressions(s) / ED Diagnoses   Final diagnoses:  Chest pain in adult    ED Discharge Orders         Ordered    methocarbamol (ROBAXIN) 500 MG tablet  2 times daily     07/30/18 0240    naproxen (NAPROSYN) 500 MG tablet  2 times daily with meals     07/30/18 0240           Garlon HatchetSanders, Jontavious Commons M, PA-C 07/30/18 0252    Ward, Layla MawKristen N, DO 07/30/18 [phone]

## 2018-07-30 NOTE — Discharge Instructions (Signed)
Your work-up today was normal.  I have sent prescriptions to your pharmacy to help with symptoms.  You can also use warm compresses if you like. Follow-up with your primary care doctor.  Return here for any new or acute changes.

## 2020-10-23 IMAGING — DX CHEST - 2 VIEW
2 series · 2 of 2 positions shown · non-contrast
Comparison: Chest radiograph 07/06/2013.

CLINICAL DATA: Patient with rib pain.

EXAM:
CHEST - 2 VIEW

[chest pa]
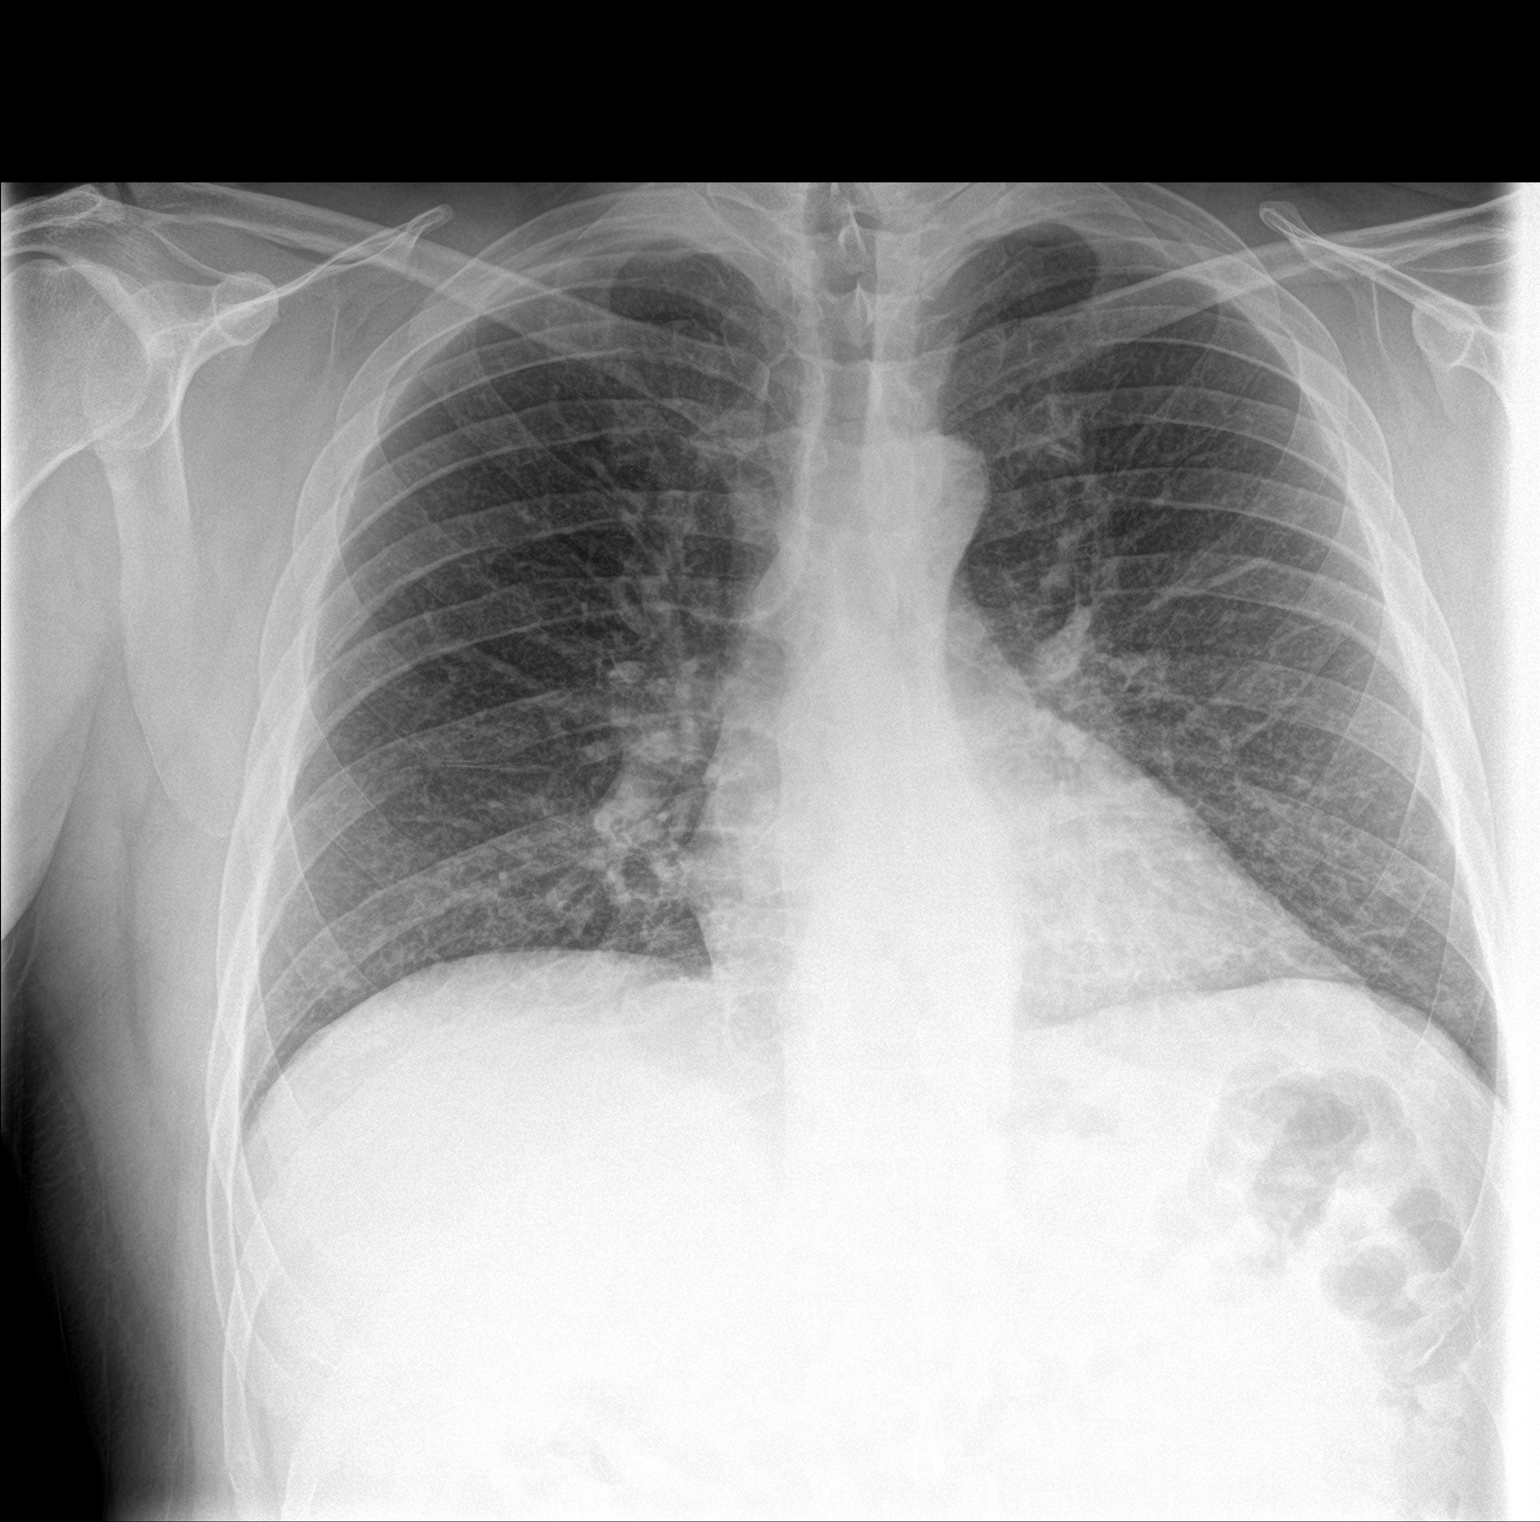

[chest lat]
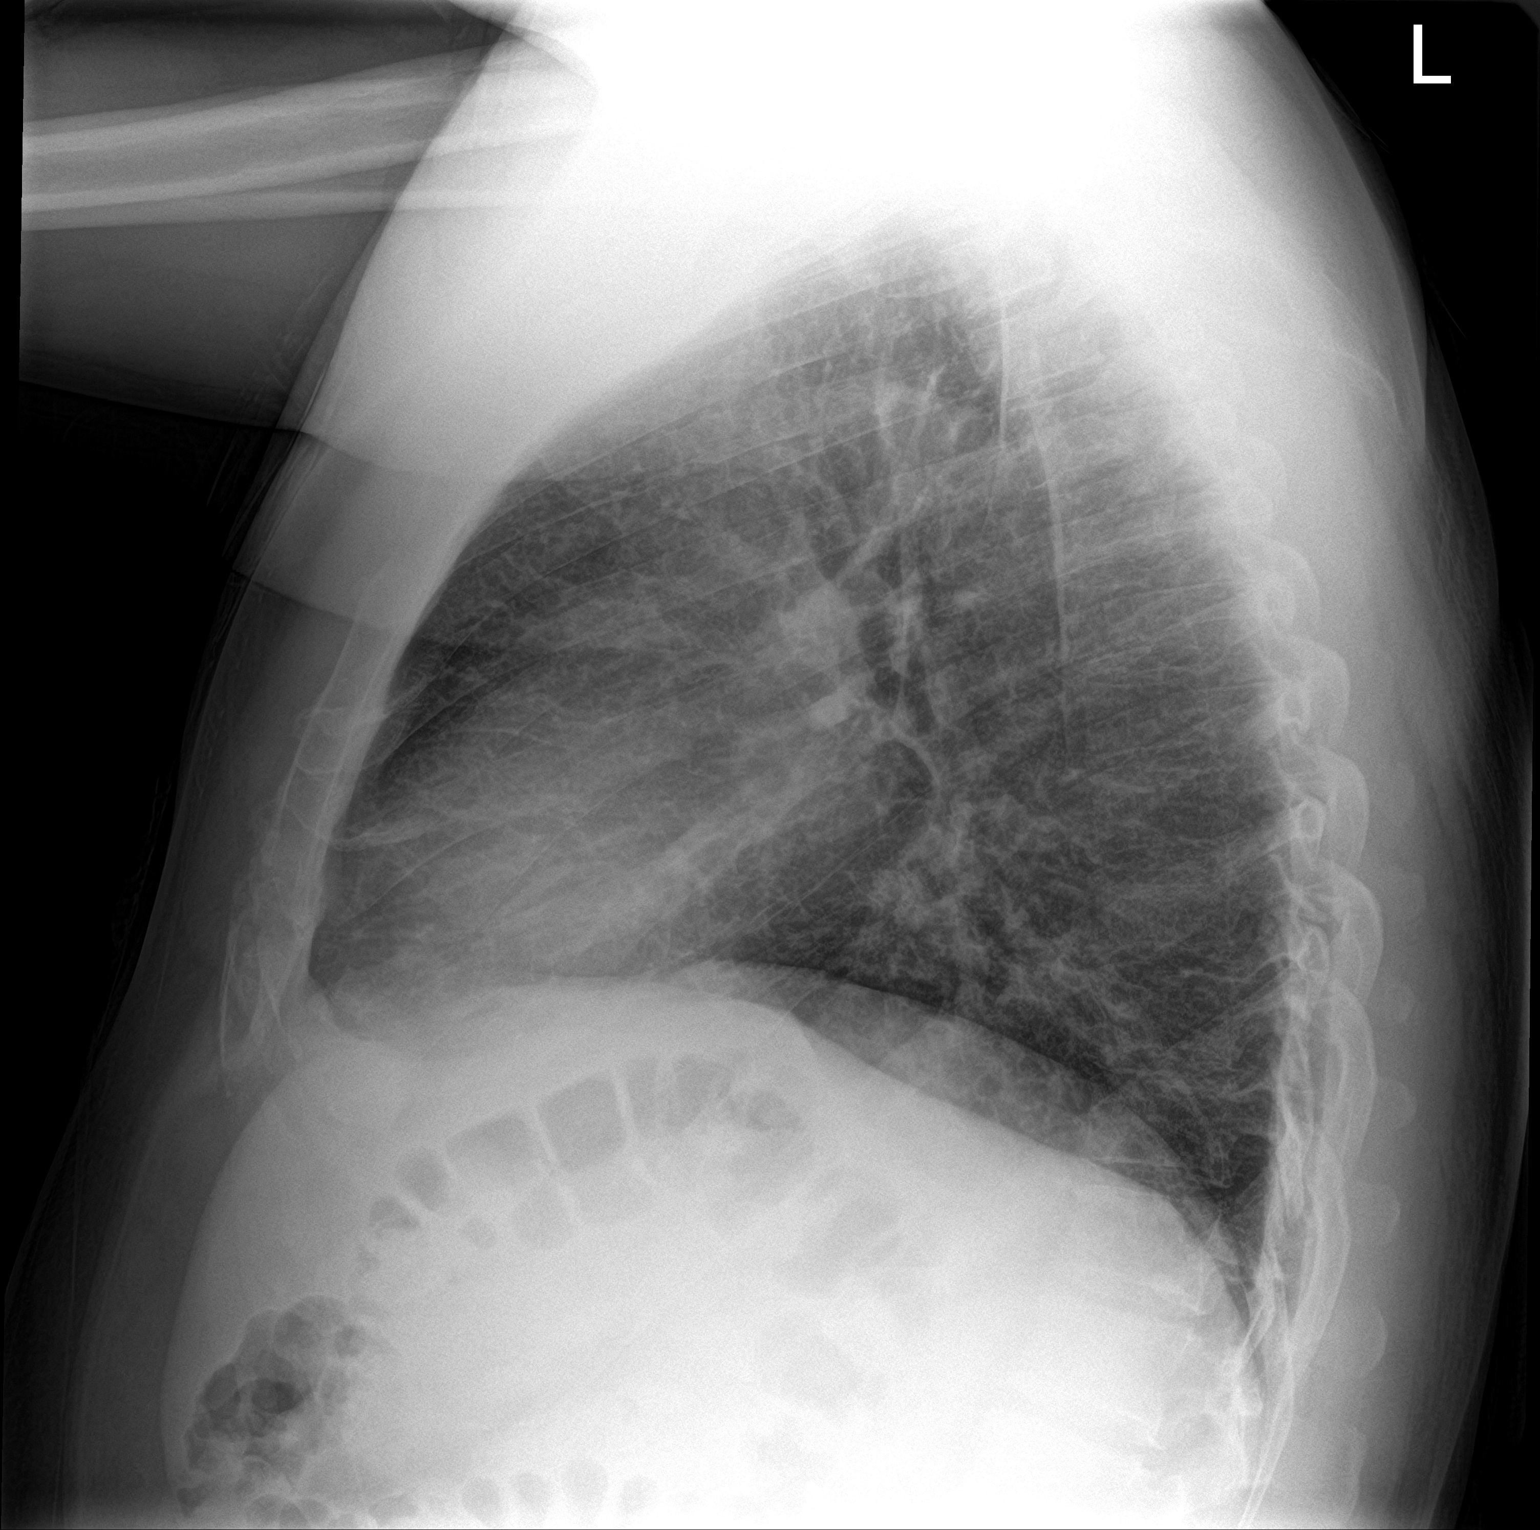

[2 of 2 positions shown; findings below may reference images not displayed]

FINDINGS: Stable cardiac and mediastinal contours. No consolidative pulmonary
opacities. No pleural effusion or pneumothorax. No evidence for
acute displaced rib fracture.
IMPRESSION: No acute cardiopulmonary process.
